# Patient Record
Sex: Female | Born: 1965 | Race: Black or African American | Hispanic: No | Marital: Single | Smoking: Never smoker
Health system: Southern US, Community
[De-identification: ages and names within clinical notes are randomized; demographics above are authoritative.]

## PROBLEM LIST (undated history)

## (undated) DIAGNOSIS — J40 Bronchitis, not specified as acute or chronic: Secondary | ICD-10-CM

## (undated) HISTORY — PX: TUBAL LIGATION: SHX77

## (undated) HISTORY — PX: GASTRIC BYPASS: SHX52

## (undated) HISTORY — PX: NECK SURGERY: SHX720

## (undated) HISTORY — PX: SHOULDER SURGERY: SHX246

---

## 2018-12-13 ENCOUNTER — Ambulatory Visit
Admission: EM | Admit: 2018-12-13 | Discharge: 2018-12-13 | Disposition: A | Discharge status: Home / Self Care | Payer: Self-pay | Attending: Physician Assistant | Admitting: Physician Assistant

## 2018-12-13 DIAGNOSIS — Y92120 Kitchen in nursing home as the place of occurrence of the external cause: Secondary | ICD-10-CM

## 2018-12-13 DIAGNOSIS — X131XXA Other contact with steam and other hot vapors, initial encounter: Secondary | ICD-10-CM

## 2018-12-13 DIAGNOSIS — T23261A Burn of second degree of back of right hand, initial encounter: Secondary | ICD-10-CM

## 2018-12-13 HISTORY — DX: Bronchitis, not specified as acute or chronic: J40

## 2018-12-13 MED ORDER — SILVER SULFADIAZINE 1 % EX CREA: 1.0000 "application " | TOPICAL_CREAM | Freq: Every day | CUTANEOUS | 0 refills | Status: DC

## 2018-12-13 NOTE — Discharge Instructions (Addendum)
For Silvadene as directed to prevent infection.  Try to keep blister intact.  Ice compress 2-3 times a day.  Follow-up with occupational health for further evaluation and management needed.  If experiencing worsening symptoms, spreading redness, increased warmth, increased pain, fever, purulent drainage, follow-up for reevaluation needed.

## 2018-12-13 NOTE — ED Provider Notes (Signed)
[facility]    CSN: [id number] Arrival date & time: 12/13/18  1421      History   Chief Complaint Chief Complaint  Patient presents with  . Burn    HPI Amber Arias is a 53 y.o. female.   53 year old female comes in for right hand burn while working. 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 States was working when steam burned her hand. She applied ice compress with some relief.  She has 2 blisters to the area that is still intact.  Denies trouble moving fingers, numbness, tingling.      Past Medical History:  Diagnosis Date  . Bronchitis     There are no active problems to display for this patient.   Past Surgical History:  Procedure Laterality Date  . GASTRIC BYPASS    . NECK SURGERY    . SHOULDER SURGERY    . TUBAL LIGATION      OB History   No obstetric history on file.      Home Medications    Prior to Admission medications   Medication Sig Start Date End Date Taking? Authorizing Provider  silver sulfADIAZINE (SILVADENE) 1 % cream Apply 1 application topically daily. 12/13/18   Belinda FisherYu, Anabel Lykins V, PA-C    Family History No family history on file.  Social History Social History   Tobacco Use  . Smoking status: Never Smoker  . Smokeless tobacco: Never Used  Substance Use Topics  . Alcohol use: Not Currently  . Drug use: Not on file     Allergies   Codeine and Lyrica [pregabalin]   Review of Systems Review of Systems  Reason unable to perform ROS: See HPI as above.     Physical Exam Triage Vital Signs ED Triage Vitals  Enc Vitals Group     BP 12/13/18 1432 128/84     Pulse Rate 12/13/18 1432 79     Resp 12/13/18 1432 18     Temp 12/13/18 1432 98.3 F (36.8 C)     Temp Source 12/13/18 1432 Oral     SpO2 12/13/18 1432 96 %     Weight --      Height --      Head Circumference --      Peak Flow --      Pain Score 12/13/18 1433 6     Pain Loc --      Pain Edu? --      Excl. in GC? --    No data found.   Updated Vital Signs BP 128/84 (BP Location: Left Arm)   Pulse 79   Temp 98.3 F (36.8 C) (Oral)   Resp 18   SpO2 96%       Physical Exam Constitutional:      General: She is not in acute distress.    Appearance: She is well-developed. She is not diaphoretic.  HENT:     Head: Normocephalic and atraumatic.  Eyes:     Conjunctiva/sclera: Conjunctivae normal.     Pupils: Pupils are equal, round, and reactive to light.  Pulmonary:     Effort: Pulmonary effort is normal. No respiratory distress.  Musculoskeletal:     Comments: Diffuse erythema to the distal dorsal right hand. 2 intact blisters. No finger swelling. Full ROM of fingers. Sensation intact. Cap refill <2s  Skin:    General: Skin is warm and dry.  Neurological:     Mental Status: She is alert and oriented to person,  place, and time.      UC Treatments / Results  Labs (all labs ordered are listed, but only abnormal results are displayed) Labs Reviewed - No data to display  EKG   Radiology No results found.  Procedures Procedures (including critical care time)  Medications Ordered in UC Medications - No data to display  Initial Impression / Assessment and Plan / UC Course  I have reviewed the triage vital signs and the nursing notes.  Pertinent labs & imaging results that were available during my care of the patient were reviewed by me and considered in my medical decision making (see chart for details).    Discussed wound care instructions. Return precautions given. Otherwise, follow up with occ health for continued monitoring.   Final Clinical Impressions(s) / UC Diagnoses   Final diagnoses:  Partial thickness burn of back of right hand, initial encounter    ED Prescriptions    Medication Sig Dispense Auth. Provider   silver sulfADIAZINE (SILVADENE) 1 % cream Apply 1 application topically daily. 50 g Tobin Chad, PA-C 12/13/18 1500

## 2018-12-13 NOTE — ED Triage Notes (Signed)
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 Blisters noted

## 2019-03-13 ENCOUNTER — Other Ambulatory Visit: Payer: Self-pay

## 2019-03-13 ENCOUNTER — Emergency Department (HOSPITAL_COMMUNITY)
Admission: EM | Admit: 2019-03-13 | Discharge: 2019-03-13 | Disposition: A | Discharge status: Home / Self Care | Payer: Self-pay | Attending: Emergency Medicine | Admitting: Emergency Medicine

## 2019-03-13 DIAGNOSIS — K625 Hemorrhage of anus and rectum: Secondary | ICD-10-CM | POA: Insufficient documentation

## 2019-03-13 DIAGNOSIS — Z5321 Procedure and treatment not carried out due to patient leaving prior to being seen by health care provider: Secondary | ICD-10-CM | POA: Insufficient documentation

## 2019-03-13 LAB — CBC WITH DIFFERENTIAL/PLATELET
Abs Immature Granulocytes: 0.02 10*3/uL (ref 0.00–0.07)
Basophils Absolute: 0 10*3/uL (ref 0.0–0.1)
Basophils Relative: 1 %
Eosinophils Absolute: 0.1 10*3/uL (ref 0.0–0.5)
Eosinophils Relative: 1 %
HCT: 42.3 % (ref 36.0–46.0)
Hemoglobin: 13.5 g/dL (ref 12.0–15.0)
Immature Granulocytes: 0 %
Lymphocytes Relative: 29 %
Lymphs Abs: 2.2 10*3/uL (ref 0.7–4.0)
MCH: 29.7 pg (ref 26.0–34.0)
MCHC: 31.9 g/dL (ref 30.0–36.0)
MCV: 93 fL (ref 80.0–100.0)
Monocytes Absolute: 0.7 10*3/uL (ref 0.1–1.0)
Monocytes Relative: 9 %
Neutro Abs: 4.4 10*3/uL (ref 1.7–7.7)
Neutrophils Relative %: 60 %
Platelets: 326 10*3/uL (ref 150–400)
RBC: 4.55 MIL/uL (ref 3.87–5.11)
RDW: 12.9 % (ref 11.5–15.5)
WBC: 7.4 10*3/uL (ref 4.0–10.5)
nRBC: 0 % (ref 0.0–0.2)

## 2019-03-13 LAB — BASIC METABOLIC PANEL
Anion gap: 9 (ref 5–15)
BUN: 13 mg/dL (ref 6–20)
CO2: 26 mmol/L (ref 22–32)
Calcium: 8.7 mg/dL — ABNORMAL LOW (ref 8.9–10.3)
Chloride: 105 mmol/L (ref 98–111)
Creatinine, Ser: 0.63 mg/dL (ref 0.44–1.00)
GFR calc Af Amer: >60 mL/min (ref >60)
GFR calc non Af Amer: >60 mL/min (ref >60)
Glucose, Bld: 139 mg/dL — ABNORMAL HIGH (ref 70–99)
Potassium: 3.6 mmol/L (ref 3.5–5.1)
Sodium: 140 mmol/L (ref 135–145)

## 2019-03-13 NOTE — ED Notes (Signed)
CURRENTLY TALKING ON PHONE.

## 2019-03-13 NOTE — ED Notes (Signed)
Pt has pink top tube in mini lab if needed

## 2019-03-13 NOTE — ED Triage Notes (Signed)
Pt presents with c/o rectal bleeding. Pt reports that she has hemorrhoids but has been passing clots and blood since last night. Pt reports her hemorrhoids have been bleeding off and on for the past week.

## 2019-05-30 ENCOUNTER — Ambulatory Visit: Payer: Managed Care, Other (non HMO)

## 2019-09-21 ENCOUNTER — Telehealth: Payer: Self-pay | Admitting: Hematology and Oncology

## 2019-09-21 NOTE — Telephone Encounter (Signed)
Received a new patient referral from [facility] for IDA. Ms. Amber Arias returned my call and has been scheduled to see Dr. Bertis Ruddy on 4/30 at 1:30pm. Pt aware to arrive 15 minutes early.

## 2019-09-28 ENCOUNTER — Inpatient Hospital Stay: Payer: Managed Care, Other (non HMO) | Attending: Hematology and Oncology | Admitting: Hematology and Oncology

## 2019-09-28 ENCOUNTER — Encounter: Payer: Self-pay | Admitting: Hematology and Oncology

## 2019-09-28 ENCOUNTER — Other Ambulatory Visit: Payer: Self-pay

## 2019-09-28 DIAGNOSIS — Z833 Family history of diabetes mellitus: Secondary | ICD-10-CM | POA: Insufficient documentation

## 2019-09-28 DIAGNOSIS — K909 Intestinal malabsorption, unspecified: Secondary | ICD-10-CM | POA: Diagnosis not present

## 2019-09-28 DIAGNOSIS — Z9884 Bariatric surgery status: Secondary | ICD-10-CM | POA: Diagnosis not present

## 2019-09-28 DIAGNOSIS — E538 Deficiency of other specified B group vitamins: Secondary | ICD-10-CM | POA: Insufficient documentation

## 2019-09-28 DIAGNOSIS — D508 Other iron deficiency anemias: Secondary | ICD-10-CM | POA: Diagnosis not present

## 2019-09-28 DIAGNOSIS — Z79899 Other long term (current) drug therapy: Secondary | ICD-10-CM | POA: Diagnosis not present

## 2019-09-28 DIAGNOSIS — E559 Vitamin D deficiency, unspecified: Secondary | ICD-10-CM | POA: Diagnosis not present

## 2019-09-28 DIAGNOSIS — Z8249 Family history of ischemic heart disease and other diseases of the circulatory system: Secondary | ICD-10-CM | POA: Diagnosis not present

## 2019-09-28 DIAGNOSIS — D509 Iron deficiency anemia, unspecified: Secondary | ICD-10-CM | POA: Insufficient documentation

## 2019-09-28 NOTE — Assessment & Plan Note (Signed)
Due to her history of intestinal bypass surgery, she would be at risk for multiple mineral deficiencies Thankfully, her recent blood work several weeks ago were within normal limits I plan to see her once a year for further follow-up and recheck blood work In the meantime, she will continue on multiple supplements

## 2019-09-28 NOTE — Progress Notes (Signed)
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 female is here because of history of gastric bypass surgery causing multiple mineral deficiencies According to the patient, she had intestinal bypass surgery approximately 11 years ago She did not know the type of gastric bypass that she had She has been placed on multiple mineral replacement therapy including iron supplement, vitamin B12, multivitamin along with vitamin D 2 to 3 years ago, she was found to be profoundly anemic and had received intravenous iron infusion She has not received any blood transfusion support She is being referred here to establish care since she has relocated to [facility] I have the opportunity to review her CBC and other blood work from 2020 and most recently from April 2021 and her vitamin B12 and iron studies were adequate  MEDICAL HISTORY:  Past Medical History:  Diagnosis Date  . Bronchitis     SURGICAL HISTORY: Past Surgical History:  Procedure Laterality Date  . GASTRIC BYPASS    . NECK SURGERY    . SHOULDER SURGERY    . TUBAL LIGATION      SOCIAL HISTORY: Social History   Socioeconomic History  . Marital status: Single    Spouse name: Not on file  . Number of children: 2  . Years of education: Not on file  . Highest education level: Not on file  Occupational History  . Occupation: Designer, jewellery  Tobacco Use  . Smoking status: Never Smoker  . Smokeless tobacco: Never Used  Substance and Sexual Activity  . Alcohol use: Not Currently  . Drug use: Not on file  . Sexual activity: Not on file  Other Topics Concern  . Not on file  Social History Narrative  . Not on file   Social Determinants of Health   Financial Resource Strain:   .  Difficulty of Paying Living Expenses:   Food Insecurity:   . Worried About Charity fundraiser in the Last Year:   . Arboriculturist in the Last Year:   Transportation Needs:   . Film/video editor (Medical):   Marland Kitchen Lack of Transportation (Non-Medical):   Physical Activity:   . Days of Exercise per Week:   . Minutes of Exercise per Session:   Stress:   . Feeling of Stress :   Social Connections:   . Frequency of Communication with Friends and Family:   . Frequency of Social Gatherings with Friends and Family:   . Attends Religious Services:   . Active Member of Clubs or Organizations:   . Attends Archivist Meetings:   Marland Kitchen Marital Status:   Intimate Partner Violence:   . Fear of Current or Ex-Partner:   . Emotionally Abused:   Marland Kitchen Physically Abused:   . Sexually Abused:     FAMILY HISTORY: Family History  Problem Relation Age of Onset  . Diabetes Mother   . Hypertension Mother   . Chronic Renal Failure Mother   . Diabetes Father   . Hypertension Father     ALLERGIES:  is allergic to codeine; lyrica [pregabalin]; and penicillins.  MEDICATIONS:  Current Outpatient Medications  Medication Sig Dispense Refill  . cholecalciferol (VITAMIN D3) 25 MCG (1000 UNIT) tablet Take 1,000 Units by mouth daily.    Marland Kitchen docusate sodium (COLACE)  100 MG capsule Take 100 mg by mouth 2 (two) times daily.    . ferrous sulfate 325 (65 FE) MG tablet Take 325 mg by mouth daily with breakfast.    . vitamin B-12 (CYANOCOBALAMIN) 1000 MCG tablet Take 1,000 mcg by mouth daily.     No current facility-administered medications for this visit.    REVIEW OF SYSTEMS:   Constitutional: Denies fevers, chills or abnormal night sweats Eyes: Denies blurriness of vision, double vision or watery eyes Ears, nose, mouth, throat, and face: Denies mucositis or sore throat Respiratory: Denies cough, dyspnea or wheezes Cardiovascular: Denies palpitation, chest discomfort or lower extremity  swelling Gastrointestinal:  Denies nausea, heartburn or change in bowel habits Skin: Denies abnormal skin rashes Lymphatics: Denies new lymphadenopathy or easy bruising Neurological:Denies numbness, tingling or new weaknesses Behavioral/Psych: Mood is stable, no new changes  All other systems were reviewed with the patient and are negative.  PHYSICAL EXAMINATION: ECOG PERFORMANCE STATUS: 0 - Asymptomatic  Vitals:   09/28/19 1354  BP: 118/67  Pulse: 86  Resp: 18  Temp: 98.5 F (36.9 C)  SpO2: 100%   Filed Weights   09/28/19 1354  Weight: 192 lb 6.4 oz (87.3 kg)    GENERAL:alert, no distress and comfortable SKIN: skin color, texture, turgor are normal, no rashes or significant lesions EYES: normal, conjunctiva are pink and non-injected, sclera clear OROPHARYNX:no exudate, no erythema and lips, buccal mucosa, and tongue normal  NECK: supple, thyroid normal size, non-tender, without nodularity LYMPH:  no palpable lymphadenopathy in the cervical, axillary or inguinal LUNGS: clear to auscultation and percussion with normal breathing effort HEART: regular rate & rhythm and no murmurs and no lower extremity edema ABDOMEN:abdomen soft, non-tender and normal bowel sounds Musculoskeletal:no cyanosis of digits and no clubbing  PSYCH: alert & oriented x 3 with fluent speech NEURO: no focal motor/sensory deficits  ASSESSMENT & PLAN:  S/P gastric bypass Due to her history of intestinal bypass surgery, she would be at risk for multiple mineral deficiencies Thankfully, her recent blood work several weeks ago were within normal limits I plan to see her once a year for further follow-up and recheck blood work In the meantime, she will continue on multiple supplements  Iron deficiency anemia Her recent iron studies are adequate I plan to recheck again next year  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 CBC with Differential/Platelet    Standing Status:   Future    Standing Expiration Date:   11/01/2020  . Ferritin    Standing Status:   Future    Standing Expiration Date:   09/27/2020  . Iron and TIBC    Standing Status:   Future    Standing Expiration Date:   11/01/2020  . Vitamin B12    Standing Status:   Future    Standing Expiration Date:   11/01/2020  . VITAMIN D 25 Hydroxy (Vit-D Deficiency, Fractures)    Standing Status:   Future    Standing Expiration Date:   09/27/2020     All questions were answered. The patient knows to call the clinic with any problems, questions or concerns.  The total time spent in the appointment was 40 minutes encounter with patients  including review of chart and various tests results, discussions about plan of care and coordination of care plan  Artis Delay, MD 4/30/20216:17 PM       Artis Delay, MD 09/28/19 6:17 PM

## 2019-09-28 NOTE — Assessment & Plan Note (Signed)
Her last vitamin B12 level was adequate She will continue on high-dose vitamin B12 replacement therapy I plan to recheck it next year

## 2019-09-28 NOTE — Assessment & Plan Note (Signed)
Her recent iron studies are adequate I plan to recheck again next year

## 2019-09-28 NOTE — Assessment & Plan Note (Signed)
She is on high-dose vitamin D supplement I plan to recheck it next year

## 2019-10-01 ENCOUNTER — Telehealth: Payer: Self-pay | Admitting: Hematology and Oncology

## 2019-10-01 NOTE — Telephone Encounter (Signed)
sch msg per 4/30. Pt is aware of appts.

## 2019-10-29 ENCOUNTER — Other Ambulatory Visit: Payer: Self-pay

## 2019-10-29 ENCOUNTER — Emergency Department (HOSPITAL_COMMUNITY): Payer: Managed Care, Other (non HMO)

## 2019-10-29 ENCOUNTER — Emergency Department (HOSPITAL_COMMUNITY)
Admission: EM | Admit: 2019-10-29 | Discharge: 2019-10-30 | Disposition: A | Discharge status: Home / Self Care | Payer: Managed Care, Other (non HMO) | Attending: Emergency Medicine | Admitting: Emergency Medicine

## 2019-10-29 ENCOUNTER — Encounter (HOSPITAL_COMMUNITY): Payer: Self-pay

## 2019-10-29 DIAGNOSIS — R1110 Vomiting, unspecified: Secondary | ICD-10-CM | POA: Diagnosis present

## 2019-10-29 DIAGNOSIS — N390 Urinary tract infection, site not specified: Secondary | ICD-10-CM | POA: Diagnosis not present

## 2019-10-29 LAB — URINALYSIS, ROUTINE W REFLEX MICROSCOPIC
Bilirubin Urine: NEGATIVE
Glucose, UA: >=500 mg/dL — AB
Hgb urine dipstick: NEGATIVE
Ketones, ur: NEGATIVE mg/dL
Nitrite: POSITIVE — AB
Protein, ur: NEGATIVE mg/dL
Specific Gravity, Urine: 1.025 (ref 1.005–1.030)
pH: 5 (ref 5.0–8.0)

## 2019-10-29 LAB — COMPREHENSIVE METABOLIC PANEL
ALT: 21 U/L (ref 0–44)
AST: 25 U/L (ref 15–41)
Albumin: 3.8 g/dL (ref 3.5–5.0)
Alkaline Phosphatase: 67 U/L (ref 38–126)
Anion gap: 10 (ref 5–15)
BUN: 12 mg/dL (ref 6–20)
CO2: 24 mmol/L (ref 22–32)
Calcium: 8.9 mg/dL (ref 8.9–10.3)
Chloride: 104 mmol/L (ref 98–111)
Creatinine, Ser: 0.85 mg/dL (ref 0.44–1.00)
GFR calc Af Amer: >60 mL/min (ref >60)
GFR calc non Af Amer: >60 mL/min (ref >60)
Glucose, Bld: 247 mg/dL — ABNORMAL HIGH (ref 70–99)
Potassium: 3.5 mmol/L (ref 3.5–5.1)
Sodium: 138 mmol/L (ref 135–145)
Total Bilirubin: 0.4 mg/dL (ref 0.3–1.2)
Total Protein: 7.2 g/dL (ref 6.5–8.1)

## 2019-10-29 LAB — CBC
HCT: 42.3 % (ref 36.0–46.0)
Hemoglobin: 13.7 g/dL (ref 12.0–15.0)
MCH: 29.7 pg (ref 26.0–34.0)
MCHC: 32.4 g/dL (ref 30.0–36.0)
MCV: 91.8 fL (ref 80.0–100.0)
Platelets: 355 10*3/uL (ref 150–400)
RBC: 4.61 MIL/uL (ref 3.87–5.11)
RDW: 12.8 % (ref 11.5–15.5)
WBC: 8.9 10*3/uL (ref 4.0–10.5)
nRBC: 0 % (ref 0.0–0.2)

## 2019-10-29 LAB — LIPASE, BLOOD: Lipase: 33 U/L (ref 11–51)

## 2019-10-29 MED ORDER — SODIUM CHLORIDE 0.9 % IV BOLUS
1000.0000 mL | Freq: Once | INTRAVENOUS | Status: AC
  Administered 2019-10-29: 1000 mL via INTRAVENOUS

## 2019-10-29 MED ORDER — SODIUM CHLORIDE 0.9% FLUSH: 3.0000 mL | Freq: Once | INTRAVENOUS | Status: DC

## 2019-10-29 NOTE — ED Notes (Signed)
PT to CT.

## 2019-10-29 NOTE — ED Notes (Signed)
Reports three bouts of emesis after eating at [employer] around 3pm.  Pt reports dry-heaves, due to having gastric bipass in 2010.  No other complaints.

## 2019-10-29 NOTE — ED Provider Notes (Addendum)
[place] [facility] DEPARTMENT Provider Note   CSN: [id number] Arrival date & time: 10/29/19  1638     History Chief Complaint  Patient presents with  . Emesis    Amber Arias is a 54 y.o. female.  Pt complains of not being able to stay awake.  Pt reports she has trouble keeping her eyes open.  Pt's daughter reports she has never seen her Mother like this.  Pt complains of nausea.  No abdominal pain no vomiting.  Pt worked her job today without problem   The history is provided by the patient. No language interpreter was used.       Past Medical History:  Diagnosis Date  . Bronchitis     Patient Active Problem List   Diagnosis Date Noted  . 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 Iron deficiency anemia 09/28/2019  . 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 Vitamin D deficiency 09/28/2019    Past Surgical History:  Procedure Laterality Date  . GASTRIC BYPASS    . NECK SURGERY    . SHOULDER SURGERY    . TUBAL LIGATION       OB History   No obstetric history on file.     Family History  Problem Relation Age of Onset  . Diabetes Mother   . Hypertension Mother   . Chronic Renal Failure Mother   . Diabetes Father   . Hypertension Father     Social History   Tobacco Use  . Smoking status: Never Smoker  . Smokeless tobacco: Never Used  Substance Use Topics  . Alcohol use: Not Currently  . Drug use: Not on file    Home Medications Prior to Admission medications   Medication Sig Start Date End Date Taking? Authorizing Provider  cholecalciferol (VITAMIN D3) 25 MCG (1000 UNIT) tablet Take 1,000 Units by mouth daily.    [provider]  docusate sodium (COLACE) 100 MG capsule Take 100 mg by mouth 2 (two) times daily.    [provider]  ferrous sulfate 325 (65 FE) MG tablet Take 325 mg by mouth daily with breakfast.    [provider]  vitamin B-12 (CYANOCOBALAMIN) 1000 MCG tablet Take  1,000 mcg by mouth daily.    [provider]    Allergies    Codeine, Lyrica [pregabalin], and Penicillins  Review of Systems   Review of Systems  Neurological: Positive for weakness.  All other systems reviewed and are negative.   Physical Exam Updated Vital Signs BP 114/71   Pulse 69   Temp 97.8 F (36.6 C) (Oral)   Resp 18   SpO2 100%   Physical Exam Vitals and nursing note reviewed.  Constitutional:      Appearance: She is well-developed.     Comments: Sleepy, difficulty staying awake during conversation  HENT:     Head: Normocephalic.     Nose: Nose normal.     Mouth/Throat:     Mouth: Mucous membranes are moist.  Cardiovascular:     Rate and Rhythm: Normal rate and regular rhythm.  Pulmonary:     Effort: Pulmonary effort is normal.  Abdominal:     General: There is no distension.  Musculoskeletal:        General: Normal range of motion.     Cervical back: Normal range of motion.  Skin:    General: Skin is warm.  Neurological:     Mental Status: She is alert and oriented to person,  place, and time.  Psychiatric:        Mood and Affect: Mood normal.     ED Results / Procedures / Treatments   Labs (all labs ordered are listed, but only abnormal results are displayed) Labs Reviewed  COMPREHENSIVE METABOLIC PANEL - Abnormal; Notable for the following components:      Result Value   Glucose, Bld 247 (*)    All other components within normal limits  URINALYSIS, ROUTINE W REFLEX MICROSCOPIC - Abnormal; Notable for the following components:   Color, Urine AMBER (*)    APPearance CLOUDY (*)    Glucose, UA >=500 (*)    Nitrite POSITIVE (*)    Leukocytes,Ua TRACE (*)    Bacteria, UA MANY (*)    All other components within normal limits  LIPASE, BLOOD  CBC    EKG None  Radiology No results found.  Procedures Procedures (including critical care time)  Medications Ordered in ED Medications  sodium chloride flush (NS) 0.9 % injection 3 mL  (has no administration in time range)  sodium chloride 0.9 % bolus 1,000 mL (1,000 mLs Intravenous New Bag/Given 10/29/19 2231)    ED Course  I have reviewed the triage vital signs and the nursing notes.  Pertinent labs & imaging results that were available during my care of the patient were reviewed by me and considered in my medical decision making (see chart for details).    MDM Rules/Calculators/A&P                      MDM:  Labs reviewed.  Ua shows many bacteria trace leukocyte  Nitrate positive  Pt reports she feels much better after IV fluids.  Pt's ct scan is normal.   Final Clinical Impression(s) / ED Diagnoses Final diagnoses:  Urinary tract infection without hematuria, site unspecified    Rx / DC Orders ED Discharge Orders         Ordered    cephALEXin (KEFLEX) 500 MG capsule  4 times daily     10/30/19 0003        An After Visit Summary was printed and given to the patient.    Elson Areas, PA-C 10/30/19 0010    Elson Areas, PA-C 10/30/19 0014    Derwood Kaplan, MD 11/05/19 [phone]

## 2019-10-29 NOTE — ED Triage Notes (Signed)
Pt reports she is not able to keep her eyes open and vomiting starting this am

## 2019-10-30 MED ORDER — CEPHALEXIN 500 MG PO CAPS: 500.0000 mg | ORAL_CAPSULE | Freq: Four times a day (QID) | ORAL | 0 refills | Status: AC

## 2019-10-30 MED ORDER — CEPHALEXIN 250 MG PO CAPS
500.0000 mg | ORAL_CAPSULE | Freq: Once | ORAL | Status: AC
  Administered 2019-10-30: 500 mg via ORAL
  Filled 2019-10-30: qty 2

## 2019-10-30 NOTE — Discharge Instructions (Addendum)
Return if any problems.

## 2020-05-26 ENCOUNTER — Other Ambulatory Visit: Payer: Self-pay

## 2020-05-26 ENCOUNTER — Emergency Department (HOSPITAL_COMMUNITY)
Admission: EM | Admit: 2020-05-26 | Discharge: 2020-05-26 | Disposition: A | Discharge status: Home / Self Care | Payer: Managed Care, Other (non HMO) | Attending: Emergency Medicine | Admitting: Emergency Medicine

## 2020-05-26 DIAGNOSIS — M792 Neuralgia and neuritis, unspecified: Secondary | ICD-10-CM | POA: Insufficient documentation

## 2020-05-26 DIAGNOSIS — R739 Hyperglycemia, unspecified: Secondary | ICD-10-CM | POA: Insufficient documentation

## 2020-05-26 DIAGNOSIS — G629 Polyneuropathy, unspecified: Secondary | ICD-10-CM

## 2020-05-26 LAB — I-STAT CHEM 8, ED
BUN: 12 mg/dL (ref 6–20)
Calcium, Ion: 1.18 mmol/L (ref 1.15–1.40)
Chloride: 104 mmol/L (ref 98–111)
Creatinine, Ser: 0.6 mg/dL (ref 0.44–1.00)
Glucose, Bld: 230 mg/dL — ABNORMAL HIGH (ref 70–99)
HCT: 40 % (ref 36.0–46.0)
Hemoglobin: 13.6 g/dL (ref 12.0–15.0)
Potassium: 3.7 mmol/L (ref 3.5–5.1)
Sodium: 140 mmol/L (ref 135–145)
TCO2: 22 mmol/L (ref 22–32)

## 2020-05-26 MED ORDER — TRAMADOL HCL 50 MG PO TABS: 50.0000 mg | ORAL_TABLET | Freq: Four times a day (QID) | ORAL | 0 refills | Status: DC | PRN

## 2020-05-26 MED ORDER — OXYCODONE-ACETAMINOPHEN 5-325 MG PO TABS
1.0000 | ORAL_TABLET | ORAL | Status: AC | PRN
  Administered 2020-05-26 (×2): 1 via ORAL
  Filled 2020-05-26 (×2): qty 1

## 2020-05-26 NOTE — ED Triage Notes (Signed)
Pt presents to ED POV. Pt c/o nueropathy pain. Pt states that she usually doesn't have to take anything but pain is worse than usual.

## 2020-05-26 NOTE — ED Provider Notes (Signed)
[facility] EMERGENCY DEPARTMENT Provider Note   CSN: [id number] Arrival date & time: 05/26/20  1434     History No chief complaint on file.   Amber Arias is a 54 y.o. female.  Patient is a 54 year old female who presents with burning nerve pain to her hands and feet.  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  She says she always has a burning pain in her left foot and left hand.  Over the last couple of weeks she has been more active and has been working harder and up on her feet more.  She is had increasing burning pain to her left leg and also now has some burning pain to her right foot.  She describes burning to the bottom of both feet but it is worse on the left.  She says it is worse than her typical neuropathic pain.  She denies any known injuries.  She denies any weakness in the legs other than occasionally she has the feeling that her left leg will give out but this is chronic and unchanged.  She also has carpal tunnel in her right hand and has some tingling to the third fourth and fifth fingers with some weakness in these fingers.  This is chronic and unchanged from her baseline.  Her new symptoms seem to be burning pain in both of her feet, worse in the left.  She has chronic pain in the left but now it is worse.  She feels like its been from her being more active recently.  She denies any worsening neck pain.  No fevers.  No back pain.  No recent injuries.  She does not have any change in her speech or vision.  No reported weakness in any of the extremities other than the weakness in her third fourth and fifth fingers of her right hand which is unchanged and related to her diagnosis of carpal tunnel.  She does not have any pain or numbness to her lower legs, it is isolated to her feet.  She denies any difficulty with her balance or gait issues.  She says it hurts to walk on them but she does not  have any difficulty moving her legs or walking other than the pain.        Past Medical History:  Diagnosis Date  . Bronchitis     Patient Active Problem List   Diagnosis Date Noted  . 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 Iron deficiency anemia 09/28/2019  . 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 Vitamin D deficiency 09/28/2019    Past Surgical History:  Procedure Laterality Date  . GASTRIC BYPASS    . NECK SURGERY    . SHOULDER SURGERY    . TUBAL LIGATION       OB History   No obstetric history on file.     Family History  Problem Relation Age of Onset  . Diabetes Mother   . Hypertension Mother   . Chronic Renal Failure Mother   . Diabetes Father   . Hypertension Father     Social History   Tobacco Use  . Smoking status: Never Smoker  . Smokeless tobacco: Never Used  Substance Use Topics  . Alcohol use: Not Currently    Home Medications Prior to Admission medications   Medication Sig Start Date End Date Taking? Authorizing Provider  cholecalciferol (VITAMIN D3) 25 MCG (  1000 UNIT) tablet Take 1,000 Units by mouth daily.   Yes [provider]  ferrous sulfate 325 (65 FE) MG tablet Take 325 mg by mouth every other day.   Yes [provider]  fluticasone furoate-vilanterol (BREO ELLIPTA) 100-25 MCG/INH AEPB Inhale 1 puff into the lungs daily as needed (for flares).   Yes [provider]  Multiple Vitamins-Calcium (ONE-A-DAY WOMENS FORMULA) TABS Take 2 tablets by mouth daily with breakfast.   Yes [provider]  traMADol (ULTRAM) 50 MG tablet Take 1 tablet (50 mg total) by mouth every 6 (six) hours as needed. 05/26/20  Yes Rolan Bucco, MD  VENTOLIN HFA 108 (90 Base) MCG/ACT inhaler Inhale 2 puffs into the lungs every 6 (six) hours as needed for wheezing or shortness of breath.   Yes [provider]  vitamin B-12 (CYANOCOBALAMIN) 1000 MCG tablet Take 1,000 mcg by mouth daily.   Yes  [provider]    Allergies    Aspirin, Codeine, Penicillin g, Penicillins, and Pregabalin  Review of Systems   Review of Systems  Constitutional: Negative for chills, diaphoresis, fatigue and fever.  HENT: Negative for congestion, rhinorrhea and sneezing.   Eyes: Negative.   Respiratory: Negative for cough, chest tightness and shortness of breath.   Cardiovascular: Negative for chest pain and leg swelling.  Gastrointestinal: Negative for abdominal pain, blood in stool, diarrhea, nausea and vomiting.  Genitourinary: Negative for difficulty urinating, flank pain, frequency and hematuria.  Musculoskeletal: Positive for arthralgias and neck pain (Chronic). Negative for back pain.       Burning pain to the bottom of both feet and both hands.  Skin: Negative for rash.  Neurological: Negative for dizziness, speech difficulty, weakness, numbness and headaches.    Physical Exam Updated Vital Signs BP 104/73   Pulse 65   Temp 97.8 F (36.6 C) (Oral)   Resp 16   SpO2 97%   Physical Exam Constitutional:      Appearance: She is well-developed and well-nourished.  HENT:     Head: Normocephalic and atraumatic.  Eyes:     Pupils: Pupils are equal, round, and reactive to light.  Cardiovascular:     Rate and Rhythm: Normal rate and regular rhythm.     Heart sounds: Normal heart sounds.  Pulmonary:     Effort: Pulmonary effort is normal. No respiratory distress.     Breath sounds: Normal breath sounds. No wheezing or rales.  Chest:     Chest wall: No tenderness.  Abdominal:     General: Bowel sounds are normal.     Palpations: Abdomen is soft.     Tenderness: There is no abdominal tenderness. There is no guarding or rebound.  Musculoskeletal:        General: No edema. Normal range of motion.     Cervical back: Normal range of motion and neck supple.  Lymphadenopathy:     Cervical: No cervical adenopathy.  Skin:    General: Skin is warm and dry.     Findings: No rash.   Neurological:     Mental Status: She is alert and oriented to person, place, and time.     Comments: Motor 5 out of 5 all extremities, she has little bit of weakness in the third fourth and fifth fingers of her right hand which she says is chronic and from her carpal tunnel but she has good strength in both upper extremities.  It symmetric in all extremities.  She has normal sensation to light  touch in all extremities, cranial nerves II through XII grossly intact  Psychiatric:        Mood and Affect: Mood and affect normal.     ED Results / Procedures / Treatments   Labs (all labs ordered are listed, but only abnormal results are displayed) Labs Reviewed  I-STAT CHEM 8, ED - Abnormal; Notable for the following components:      Result Value   Glucose, Bld 230 (*)    All other components within normal limits    EKG None  Radiology No results found.  Procedures Procedures (including critical care time)  Medications Ordered in ED Medications  oxyCODONE-acetaminophen (PERCOCET/ROXICET) 5-325 MG per tablet 1 tablet (1 tablet Oral Given 05/26/20 1843)    ED Course  I have reviewed the triage vital signs and the nursing notes.  Pertinent labs & imaging results that were available during my care of the patient were reviewed by me and considered in my medical decision making (see chart for details).    MDM Rules/Calculators/A&P                          Patient is a 55 year old female who presents with burning nerve type pain to her hands and feet.  She has had some chronic symptoms for a while but says they have gotten worse.  She does not have any weakness or strokelike symptoms.  It does not seem to be consistent with a stroke.  It seems to be more painful and nervelike pain.  She does not have any worsening neck pain or deficits that would be concerning for a significant cervical lesion.  She is afebrile.  Her electrolytes are nonconcerning.  Her glucose is elevated at 230.  This  may be contributing to her neuropathy.  She is not a known diabetic.  She had moved down here 2 years ago and has not yet established primary care.  She is currently uninsured but says that she is in the process of signing up for an insurance plan with her work.  I will consult transition of care team to attempt to get her primary care appointment expeditiously.  I did also give her a couple options for follow-up.  I discussed with her diet choices to help better control her sugars and will need further work-up as an outpatient.  She was discharged home in good condition.  Return precautions were given. Final Clinical Impression(s) / ED Diagnoses Final diagnoses:  Neuropathy  Hyperglycemia    Rx / DC Orders ED Discharge Orders         Ordered    traMADol (ULTRAM) 50 MG tablet  Every 6 hours PRN        05/26/20 2045           Rolan Bucco, MD 05/26/20 2050

## 2020-06-18 ENCOUNTER — Encounter: Payer: Self-pay | Admitting: Student

## 2020-06-23 ENCOUNTER — Encounter: Payer: Self-pay | Admitting: Student

## 2020-07-02 ENCOUNTER — Ambulatory Visit (INDEPENDENT_AMBULATORY_CARE_PROVIDER_SITE_OTHER): Payer: Self-pay | Admitting: Student

## 2020-07-02 ENCOUNTER — Other Ambulatory Visit: Payer: Self-pay

## 2020-07-02 ENCOUNTER — Encounter: Payer: Self-pay | Admitting: Student

## 2020-07-02 VITALS — BP 121/74 | HR 67 | Temp 98.0°F | Wt 190.6 lb

## 2020-07-02 DIAGNOSIS — T148XXA Other injury of unspecified body region, initial encounter: Secondary | ICD-10-CM

## 2020-07-02 DIAGNOSIS — R739 Hyperglycemia, unspecified: Secondary | ICD-10-CM

## 2020-07-02 DIAGNOSIS — R200 Anesthesia of skin: Secondary | ICD-10-CM

## 2020-07-02 DIAGNOSIS — R202 Paresthesia of skin: Secondary | ICD-10-CM

## 2020-07-02 DIAGNOSIS — R7303 Prediabetes: Secondary | ICD-10-CM

## 2020-07-02 LAB — POCT GLYCOSYLATED HEMOGLOBIN (HGB A1C): Hemoglobin A1C: 5.8 % — AB (ref 4.0–5.6)

## 2020-07-02 LAB — GLUCOSE, CAPILLARY: Glucose-Capillary: 90 mg/dL (ref 70–99)

## 2020-07-02 MED ORDER — DULOXETINE HCL 30 MG PO CPEP: 30.0000 mg | ORAL_CAPSULE | Freq: Every day | ORAL | 2 refills | Status: DC

## 2020-07-02 NOTE — Assessment & Plan Note (Signed)
Assessment: A1c of 5.8 today. Recently elevated glucose levels in the ED  Plan: - Recheck A1c in year. Will call and discuss with patient lab findings.

## 2020-07-02 NOTE — Patient Instructions (Signed)
Thank you, [name] for allowing [place] to provide your care today. Today we discussed your nerve pain.   We will be planning to obtain all of your medical records. Once we have these I will review them to get a better understanding of what has been done thus far.   I will also be starting you on cymbalta. We will start with one capsule a week. If you tolerate this well, take two a day after one week.  I have ordered the following labs for you:   Referrals ordered today:   Referral Orders  No referral(s) requested today     I have ordered the following medication/changed the following medications:   Stop the following medications: Medications Discontinued During This Encounter  Medication Reason  . traMADol (ULTRAM) 50 MG tablet Discontinued by provider     Start the following medications: Meds ordered this encounter  Medications  . DULoxetine (CYMBALTA) 30 MG capsule    Sig: Take 1 capsule (30 mg total) by mouth daily. Take 1 capsule (30 mg) by mouth daily for the first week. Starting on the second week take 2 capsules daily if you were able to tolerate the 30 mg daily.    Dispense:  30 capsule    Refill:  2     Follow up: 3 Months    Remember: If you every feel as though your symptoms are worsening, please call and make an appointment.    Should you have any questions or concerns please call the internal medicine clinic at [phone].     Thalia Bloodgood, D.O. [facility]

## 2020-07-02 NOTE — Progress Notes (Signed)
   CC: neurological pain  HPI:  [name] is a 55 y.o. female with a past medical history stated below and presents today for neurological pain. Please see problem based assessment and plan for additional details.  Past Medical History:  Diagnosis Date  . Bronchitis     Current Outpatient Medications on File Prior to Visit  Medication Sig Dispense Refill  . cholecalciferol (VITAMIN D3) 25 MCG (1000 UNIT) tablet Take 1,000 Units by mouth daily.    . ferrous sulfate 325 (65 FE) MG tablet Take 325 mg by mouth every other day.    . fluticasone furoate-vilanterol (BREO ELLIPTA) 100-25 MCG/INH AEPB Inhale 1 puff into the lungs daily as needed (for flares).    . Multiple Vitamins-Calcium (ONE-A-DAY WOMENS FORMULA) TABS Take 2 tablets by mouth daily with breakfast.    . VENTOLIN HFA 108 (90 Base) MCG/ACT inhaler Inhale 2 puffs into the lungs every 6 (six) hours as needed for wheezing or shortness of breath.    . vitamin B-12 (CYANOCOBALAMIN) 1000 MCG tablet Take 1,000 mcg by mouth daily.     No current facility-administered medications on file prior to visit.    Family History  Problem Relation Age of Onset  . Diabetes Mother   . Hypertension Mother   . Chronic Renal Failure Mother   . Diabetes Father   . Hypertension Father     Social History   Socioeconomic History  . Marital status: Single    Spouse name: Not on file  . Number of children: 2  . Years of education: Not on file  . Highest education level: Not on file  Occupational History  . Occupation: Optometrist  Tobacco Use  . Smoking status: Never Smoker  . Smokeless tobacco: Never Used  Substance and Sexual Activity  . Alcohol use: Not Currently  . Drug use: Not on file  . Sexual activity: Not on file  Other Topics Concern  . Not on file  Social History Narrative  . Not on file   Social Determinants of Health   Financial Resource Strain: Not on file  Food Insecurity: Not on file  Transportation  Needs: Not on file  Physical Activity: Not on file  Stress: Not on file  Social Connections: Not on file  Intimate Partner Violence: Not on file    Review of Systems: ROS negative except for what is noted on the assessment and plan.  Vitals:   07/02/20 1323  BP: 121/74  Pulse: 67  Temp: 98 F (36.7 C)  TempSrc: Oral  SpO2: 100%  Weight: 190 lb 9.6 oz (86.5 kg)    Physical Exam: Constitutional: well-appearing, in no acute distress HENT: normocephalic atraumatic Eyes: conjunctiva non-erythematous Neck: supple Cardiovascular: regular rate and rhythm Pulmonary/Chest: normal work of breathing on room air Abdominal: non-distended MSK: normal bulk and tone. Unremarkable ROM of upper extremities. Dorsalis pedis and posterior tibial pulses 2+ Neurological: alert & oriented x 3. Upper extremity strength 5/5. Lower extremity strength 5/5. 2+ brachial and patellar reflexes bilaterally. Normal sensation throughout. Patient with difficulty contracting right 5th digit. Patient with unremarkable gate.  Skin: warm and dry Psych: Tearful at times when discussing her chronic neurological pain  Assessment & Plan:   See Encounters Tab for problem based charting.  Patient discussed with Dr. Mackey Birchwood, D.O. [facility], PGY-1 Pager: [phone], Phone: [phone] Date 07/02/2020 Time 5:57 PM

## 2020-07-02 NOTE — Assessment & Plan Note (Signed)
Lab Results  Component Value Date   HGBA1C 5.8 (A) 07/02/2020     Assessment: Patient with episodes of hyperglycemia during recent ED visits. A1c repeated today, 5.8. Patient prediabetic.   Plan: - Continue yearly A1c

## 2020-07-02 NOTE — Assessment & Plan Note (Addendum)
Assessment: Patient with extensive neurological history. She endorses in 1995 she fell on ice and had lower back (L5/S1) problems since then. She has struggled with left lower extremity weakness/numbness and tingling. 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 Since then, she endorses having bilateral upper extremity weakness as well as numbness/tingling. In 2010, she had weight loss surgery to help relieve pressure from her lower back.   She describes the pain as numbness/tingling and states the left sided pain has improved, but now her right side is hurting. She is nervous that one day she will wake up and not be able to walk. 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 She has a fear of dropping things at work and therefore puts herself into positions where she can set items down quickly.   The patient was followed by neurosurgery in [facility] and has since moved to [place]. Per the the patient, neurosurgery there informed her that there was nothing she could do about the chronic pain and that no other procedure would be indicated. She notes trying multiple medications for pain including gabapentin, pregabalin, oxycodone, tramadol, tylenol, NSAIDS. She notes the narcotics have helped some but do not last long.   Her neurological exam was unremarkable aside from the contracture of the right 5th digit.    Recommended assessing for reversible causes in setting of gastric bypass surgery, despite patient being adherent to her vitamin regimen. She requests to wait on laboratory testing until her insurance has been approved by the clinic.   Do not believe urgent need for neurological evaluation or imaging at this time. Patient appears stable and to not have drastic acute change in status.  Plan: - Start cymablta 30 mg a day first week, if tolerable increase to 60 mg daily - trazodone discontinued as  interaction with cymbalta for possible serotonin syndrome - if no improvement on cymbalta or unable to tolerate, try TCA - Once insurance approved, order cbc, cmp, B12.  - if pain continues, consider neurology consult - work note, off until 07/07/20

## 2020-07-07 NOTE — Progress Notes (Signed)
Internal Medicine Clinic Attending  Case discussed with Dr. Katsadouros  At the time of the visit.  We reviewed the resident's history and exam and pertinent patient test results.  I agree with the assessment, diagnosis, and plan of care documented in the resident's note.  

## 2020-08-29 ENCOUNTER — Other Ambulatory Visit: Payer: Managed Care, Other (non HMO)

## 2020-08-29 ENCOUNTER — Ambulatory Visit: Payer: Managed Care, Other (non HMO) | Admitting: Hematology and Oncology

## 2020-09-05 ENCOUNTER — Encounter: Payer: Self-pay | Admitting: Hematology and Oncology

## 2020-09-05 ENCOUNTER — Ambulatory Visit: Payer: Self-pay | Admitting: Hematology and Oncology

## 2020-09-05 ENCOUNTER — Inpatient Hospital Stay: Payer: Self-pay | Attending: Hematology and Oncology

## 2020-09-30 ENCOUNTER — Ambulatory Visit: Payer: Self-pay | Admitting: Student

## 2020-09-30 ENCOUNTER — Other Ambulatory Visit: Payer: Self-pay

## 2020-09-30 ENCOUNTER — Encounter: Payer: Self-pay | Admitting: Student

## 2020-09-30 VITALS — BP 112/76 | HR 79 | Temp 98.2°F | Ht 68.0 in | Wt 190.9 lb

## 2020-09-30 DIAGNOSIS — D509 Iron deficiency anemia, unspecified: Secondary | ICD-10-CM

## 2020-09-30 DIAGNOSIS — Z0000 Encounter for general adult medical examination without abnormal findings: Secondary | ICD-10-CM

## 2020-09-30 DIAGNOSIS — R202 Paresthesia of skin: Secondary | ICD-10-CM

## 2020-09-30 DIAGNOSIS — K909 Intestinal malabsorption, unspecified: Secondary | ICD-10-CM

## 2020-09-30 DIAGNOSIS — E538 Deficiency of other specified B group vitamins: Secondary | ICD-10-CM

## 2020-09-30 DIAGNOSIS — R200 Anesthesia of skin: Secondary | ICD-10-CM

## 2020-09-30 MED ORDER — AMITRIPTYLINE HCL 25 MG PO TABS: 25.0000 mg | ORAL_TABLET | Freq: Every day | ORAL | 0 refills | Status: AC

## 2020-09-30 NOTE — Assessment & Plan Note (Signed)
Assessment: Iron studies were planned to be rechecked by oncology however she missed the appointment.  Patient currently uninsured and will plan to wait until she is insured to have oncology check these at her follow-up.   Plan: -Continue to follow with oncology

## 2020-09-30 NOTE — Assessment & Plan Note (Signed)
Patient given information about free mammogram/colorectal screening.

## 2020-09-30 NOTE — Assessment & Plan Note (Addendum)
  Assessment: Continues to have numbness/tingling that is worsening, specifically on the right side.  She notes that she filled out a release of information at her last visit for [place] neurosurgery records from [place].  However I as well as most of her not able to find this.  We will asked the patient to return to fill out these forms.  She was unable to take the Cymbalta as it caused GI upset.  We will discontinue this and try amitriptyline.  In the past she is taking tramadol at night for sleep, however we will start with amitriptyline and see how she does.  She has tried tramadol in the past and that helped her sleep as well as with the pain.  At this point with worsening of her numbness and tingling I do believe she needs referral to either neurosurgery or neurology.  She is working on applying to orange card and would like to have this before being referred.  Neurological exam is unchanged from prior visit.  Do not believe she needs urgent neurosurgery evaluation at this time.  No red flag symptoms.  Plan: -Obtain records from prior neurosurgeon -Start amitriptyline 25 mg daily at bedtime.  If no improvement consider tramadol -Discontinue Cymbalta -Check B12 -Check for reversible causes and repeat imaging once patient has orange card approval.  These were offered to her but she would rather wait. -Refer to neurosurgery once orange card and approval occurs.

## 2020-09-30 NOTE — Progress Notes (Signed)
   CC: Numbness and tingling  HPI:  [name] is a 55 y.o. female with a past medical history stated below and presents today for numbness and tingling. Please see problem based assessment and plan for additional details.  Past Medical History:  Diagnosis Date  . Bronchitis     Current Outpatient Medications on File Prior to Visit  Medication Sig Dispense Refill  . cholecalciferol (VITAMIN D3) 25 MCG (1000 UNIT) tablet Take 1,000 Units by mouth daily.    . DULoxetine (CYMBALTA) 30 MG capsule Take 1 capsule (30 mg total) by mouth daily. Take 1 capsule (30 mg) by mouth daily for the first week. Starting on the second week take 2 capsules daily if you were able to tolerate the 30 mg daily. 30 capsule 2  . ferrous sulfate 325 (65 FE) MG tablet Take 325 mg by mouth every other day.    . fluticasone furoate-vilanterol (BREO ELLIPTA) 100-25 MCG/INH AEPB Inhale 1 puff into the lungs daily as needed (for flares).    . Multiple Vitamins-Calcium (ONE-A-DAY WOMENS FORMULA) TABS Take 2 tablets by mouth daily with breakfast.    . VENTOLIN HFA 108 (90 Base) MCG/ACT inhaler Inhale 2 puffs into the lungs every 6 (six) hours as needed for wheezing or shortness of breath.    . vitamin B-12 (CYANOCOBALAMIN) 1000 MCG tablet Take 1,000 mcg by mouth daily.     No current facility-administered medications on file prior to visit.    Family History  Problem Relation Age of Onset  . Diabetes Mother   . Hypertension Mother   . Chronic Renal Failure Mother   . Diabetes Father   . Hypertension Father     Social History   Socioeconomic History  . Marital status: Single    Spouse name: Not on file  . Number of children: 2  . Years of education: Not on file  . Highest education level: Not on file  Occupational History  . Occupation: Optometrist  Tobacco Use  . Smoking status: Never Smoker  . Smokeless tobacco: Never Used  Substance and Sexual Activity  . Alcohol use: Not Currently  .  Drug use: Not on file  . Sexual activity: Not on file  Other Topics Concern  . Not on file  Social History Narrative  . Not on file   Social Determinants of Health   Financial Resource Strain: Not on file  Food Insecurity: Not on file  Transportation Needs: Not on file  Physical Activity: Not on file  Stress: Not on file  Social Connections: Not on file  Intimate Partner Violence: Not on file    Review of Systems: ROS negative except for what is noted on the assessment and plan.  There were no vitals filed for this visit.   Physical Exam: Constitutional: Well-appearing, sitting comfortably, no acute distress HENT: normocephalic atraumatic, mucous membranes moist Eyes: conjunctiva non-erythematous Neck: supple Cardiovascular: regular rate and rhythm, no m/r/g Pulmonary/Chest: normal work of breathing on room air, lungs clear to auscultation bilaterally Neurological: alert & oriented x 3, 5/5 strength in bilateral upper and lower extremities Skin: warm and dry Psych: Normal mood and thought process   Assessment & Plan:   See Encounters Tab for problem based charting.  Patient discussed with Dr. Mackey Birchwood, D.O. [facility], PGY-1 Pager: [phone], Phone: [phone] Date 09/30/2020 Time 9:19 AM

## 2020-09-30 NOTE — Assessment & Plan Note (Deleted)
Assessment: We will repeat B12 level today.  Levels to be checked during oncology appointment however she missed that.  Patient notes she has been taking B12 supplementation.   Plan: -Check B12 level -Continue 1000 mcg daily

## 2020-09-30 NOTE — Patient Instructions (Addendum)
Thank you, Ms.Norberto Sorenson for allowing [place] to provide your care today. Today we discussed:  Numbness/Tingling We will continue to work with you to try to control your pain.  We will be starting you on amitriptyline 25 mg nightly.  This may cause dizziness as well as drowsiness, only take at night.  Please stop taking the Cymbalta.  We will check a B12 today and call you if these labs are abnormal.  Once you have insurance approval we will recommend you to neurology or neurosurgery as your pain is getting worse.  History of Gastric Bypass Please call and follow up with up with your oncologist.  Health Maintenance - Mammogram/Colon Cancer Screening We have provided you with the phone number of the free mobile cancer screening unit, they will screen you for breast cancer as well as colon cancer despite being uninsured.  Please call the number provided and have these tests done at your earliest convenience.  I have ordered the following labs for you:   Lab Orders     Vitamin B12   Tests ordered today:  None  Referrals ordered today:   Referral Orders  No referral(s) requested today     I have ordered the following medication/changed the following medications:   Stop the following medications: Medications Discontinued During This Encounter  Medication Reason  . DULoxetine (CYMBALTA) 30 MG capsule      Start the following medications: Meds ordered this encounter  Medications  . amitriptyline (ELAVIL) 25 MG tablet    Sig: Take 1 tablet (25 mg total) by mouth at bedtime.    Dispense:  90 tablet    Refill:  0     Follow up: 3 months    Remember: Please follow-up with our financial services to apply for orange card insurance.  Should you have any questions or concerns please call the internal medicine clinic at [phone].    Thalia Bloodgood, D.O. [facility]

## 2020-10-01 LAB — VITAMIN B12: Vitamin B-12: 656 pg/mL (ref 232–1245)

## 2020-12-01 ENCOUNTER — Other Ambulatory Visit: Payer: Self-pay

## 2020-12-01 ENCOUNTER — Encounter (HOSPITAL_COMMUNITY): Payer: Self-pay

## 2020-12-01 ENCOUNTER — Emergency Department (HOSPITAL_COMMUNITY)
Admission: EM | Admit: 2020-12-01 | Discharge: 2020-12-01 | Disposition: A | Discharge status: Home / Self Care | Payer: BC Managed Care – PPO | Attending: Emergency Medicine | Admitting: Emergency Medicine

## 2020-12-01 DIAGNOSIS — R300 Dysuria: Secondary | ICD-10-CM | POA: Diagnosis not present

## 2020-12-01 DIAGNOSIS — M79672 Pain in left foot: Secondary | ICD-10-CM | POA: Insufficient documentation

## 2020-12-01 DIAGNOSIS — R109 Unspecified abdominal pain: Secondary | ICD-10-CM | POA: Insufficient documentation

## 2020-12-01 DIAGNOSIS — R319 Hematuria, unspecified: Secondary | ICD-10-CM | POA: Insufficient documentation

## 2020-12-01 LAB — URINALYSIS, ROUTINE W REFLEX MICROSCOPIC
Bilirubin Urine: NEGATIVE
Glucose, UA: NEGATIVE mg/dL
Hgb urine dipstick: NEGATIVE
Ketones, ur: NEGATIVE mg/dL
Nitrite: POSITIVE — AB
Protein, ur: NEGATIVE mg/dL
Specific Gravity, Urine: 1.017 (ref 1.005–1.030)
pH: 5 (ref 5.0–8.0)

## 2020-12-01 MED ORDER — CEPHALEXIN 500 MG PO CAPS: 500.0000 mg | ORAL_CAPSULE | Freq: Four times a day (QID) | ORAL | 0 refills | Status: AC

## 2020-12-01 NOTE — ED Triage Notes (Addendum)
Pt reports right sided flank pain, hematuria, and urinary frequency that began last night. Pt also endorses left foot pain, and states she has hx of nerve damage in this foot.

## 2020-12-01 NOTE — ED Provider Notes (Signed)
[facility]-EMERGENCY DEPT Provider Note   CSN: [id number] Arrival date & time: 12/01/20  1152     History Chief Complaint  Patient presents with   Flank Pain   Foot Pain    Amber Arias is a 55 y.o. female.  55 year old female presents with several days of hematuria along with dysuria.  Self diagnosed herself with a UTI and has been treating with cranberry juice.  Developed some mild flank pain which is since resolved.  Denies any fever or chills.  Also notes chronic left foot pain due to prior fracture which is unchanged and unresponsive to her home medications.      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     Family History  Problem Relation Age of Onset   Diabetes Mother    Hypertension Mother    Chronic Renal Failure Mother    Diabetes Father    Hypertension Father     Social History   Tobacco Use   Smoking status: Never   Smokeless tobacco: Never  Substance Use Topics   Alcohol use: Not Currently    Home Medications Prior to Admission medications   Medication Sig Start Date End Date Taking? Authorizing Provider  amitriptyline (ELAVIL) 25 MG tablet Take 1 tablet (25 mg total) by mouth at bedtime. 09/30/20   Belva Agee, MD  cholecalciferol (VITAMIN D3) 25 MCG (1000 UNIT) tablet Take 1,000 Units by mouth daily.    [provider]  ferrous sulfate 325 (65 FE) MG tablet Take 325 mg by mouth every other  day.    [provider]  fluticasone furoate-vilanterol (BREO ELLIPTA) 100-25 MCG/INH AEPB Inhale 1 puff into the lungs daily as needed (for flares).    [provider]  Multiple Vitamins-Calcium (ONE-A-DAY WOMENS FORMULA) TABS Take 2 tablets by mouth daily with breakfast.    [provider]  VENTOLIN HFA 108 (90 Base) MCG/ACT inhaler Inhale 2 puffs into the lungs every 6 (six) hours as needed for wheezing or shortness of breath.    [provider]  vitamin B-12 (CYANOCOBALAMIN) 1000 MCG tablet Take 1,000 mcg by mouth daily.    [provider]    Allergies    Aspirin, Codeine, Penicillin g, Penicillins, and Pregabalin  Review of Systems   Review of Systems  All other systems reviewed and are negative.  Physical Exam Updated Vital Signs BP 131/83 (BP Location: Left Arm)   Pulse 86   Temp 98 F (36.7 C) (Oral)   Resp 18   Ht 1.727 m (5\' 8" )   Wt 84.8 kg   SpO2 98%   BMI 28.43 kg/m   Physical Exam Vitals and nursing note reviewed.  Constitutional:      General: She is not in acute distress.    Appearance: Normal appearance. She is well-developed. She is not toxic-appearing.  HENT:  Head: Normocephalic and atraumatic.  Eyes:     General: Lids are normal.     Conjunctiva/sclera: Conjunctivae normal.     Pupils: Pupils are equal, round, and reactive to light.  Neck:     Thyroid: No thyroid mass.     Trachea: No tracheal deviation.  Cardiovascular:     Rate and Rhythm: Normal rate and regular rhythm.     Heart sounds: Normal heart sounds. No murmur heard.   No gallop.  Pulmonary:     Effort: Pulmonary effort is normal. No respiratory distress.     Breath sounds: Normal breath sounds. No stridor. No decreased breath sounds, wheezing, rhonchi or rales.  Abdominal:     General: There is no distension.     Palpations: Abdomen is soft.     Tenderness: There is no abdominal tenderness. There is no rebound.  Musculoskeletal:         General: Normal range of motion.     Cervical back: Normal range of motion and neck supple.     Left foot: Tenderness present.     Comments: Neurovascular status intact left foot  Skin:    General: Skin is warm and dry.     Findings: No abrasion or rash.  Neurological:     Mental Status: She is alert and oriented to person, place, and time. Mental status is at baseline.     GCS: GCS eye subscore is 4. GCS verbal subscore is 5. GCS motor subscore is 6.     Cranial Nerves: Cranial nerves are intact. No cranial nerve deficit.     Sensory: No sensory deficit.     Motor: Motor function is intact.  Psychiatric:        Attention and Perception: Attention normal.        Speech: Speech normal.        Behavior: Behavior normal.    ED Results / Procedures / Treatments   Labs (all labs ordered are listed, but only abnormal results are displayed) Labs Reviewed  URINE CULTURE  URINALYSIS, ROUTINE W REFLEX MICROSCOPIC    EKG None  Radiology No results found.  Procedures Procedures   Medications Ordered in ED Medications - No data to display  ED Course  I have reviewed the triage vital signs and the nursing notes.  Pertinent labs & imaging results that were available during my care of the patient were reviewed by me and considered in my medical decision making (see chart for details).    MDM Rules/Calculators/A&P                          Patient with urinalysis consistent with infection.  Will place on Keflex and discharge Final Clinical Impression(s) / ED Diagnoses Final diagnoses:  None    Rx / DC Orders ED Discharge Orders     None        Lorre Nick, MD 12/01/20 1308

## 2020-12-03 LAB — URINE CULTURE: Culture: >=100000 — AB

## 2020-12-04 ENCOUNTER — Telehealth: Payer: Self-pay | Admitting: Emergency Medicine

## 2020-12-04 NOTE — Telephone Encounter (Signed)
Post ED Visit - Positive Culture Follow-up  Culture report reviewed by antimicrobial stewardship pharmacist: [facility] []  Nathan Batchelder, Pharm.D. []  , Pharm.D., BCPS AQ-ID []  , Pharm.D., BCPS []  Celedonio Miyamoto, Pharm.D., BCPS []  Leonardo, Garvin Fila.D., BCPS, AAHIVP []  , Pharm.D., BCPS, AAHIVP []  Georgina Pillion, PharmD, BCPS []  , PharmD, BCPS []  [place], PharmD, BCPS []  [address], PharmD []  , PharmD, BCPS []  Estella Husk, PharmD  Pharmacy Team []  Lysle Pearl, PharmD []  , PharmD []  Phillips Climes, PharmD []  , Rph []  Agapito Games) , PharmD []  Verlan Friends, PharmD []  , PharmD []  Mervyn Gay, PharmD []  , PharmD []  Vinnie Level, PharmD []  [facility], PharmD []  , PharmD []  Len Childs, PharmD   Positive urine culture Treated with cephalexin, no further patient follow-up is required at this time.  12/04/2020, 11:16 AM

## 2021-01-26 DIAGNOSIS — Z0000 Encounter for general adult medical examination without abnormal findings: Secondary | ICD-10-CM | POA: Diagnosis not present

## 2021-01-26 DIAGNOSIS — Z13220 Encounter for screening for lipoid disorders: Secondary | ICD-10-CM | POA: Diagnosis not present

## 2021-01-26 DIAGNOSIS — Z9889 Other specified postprocedural states: Secondary | ICD-10-CM | POA: Diagnosis not present

## 2021-03-31 DIAGNOSIS — W19XXXA Unspecified fall, initial encounter: Secondary | ICD-10-CM | POA: Diagnosis not present

## 2021-03-31 DIAGNOSIS — M24549 Contracture, unspecified hand: Secondary | ICD-10-CM | POA: Diagnosis not present

## 2021-03-31 DIAGNOSIS — M542 Cervicalgia: Secondary | ICD-10-CM | POA: Diagnosis not present

## 2021-03-31 DIAGNOSIS — R200 Anesthesia of skin: Secondary | ICD-10-CM | POA: Diagnosis not present

## 2021-04-15 DIAGNOSIS — Z23 Encounter for immunization: Secondary | ICD-10-CM | POA: Diagnosis not present

## 2021-05-20 DIAGNOSIS — R0981 Nasal congestion: Secondary | ICD-10-CM | POA: Diagnosis not present

## 2021-05-20 DIAGNOSIS — J101 Influenza due to other identified influenza virus with other respiratory manifestations: Secondary | ICD-10-CM | POA: Diagnosis not present

## 2021-05-20 DIAGNOSIS — Z03818 Encounter for observation for suspected exposure to other biological agents ruled out: Secondary | ICD-10-CM | POA: Diagnosis not present

## 2021-05-20 DIAGNOSIS — R509 Fever, unspecified: Secondary | ICD-10-CM | POA: Diagnosis not present

## 2021-05-20 DIAGNOSIS — R059 Cough, unspecified: Secondary | ICD-10-CM | POA: Diagnosis not present

## 2021-07-01 DIAGNOSIS — M5412 Radiculopathy, cervical region: Secondary | ICD-10-CM | POA: Diagnosis not present

## 2021-07-01 DIAGNOSIS — Z6828 Body mass index (BMI) 28.0-28.9, adult: Secondary | ICD-10-CM | POA: Diagnosis not present

## 2021-07-10 ENCOUNTER — Other Ambulatory Visit: Payer: Self-pay | Admitting: Neurological Surgery

## 2021-07-10 DIAGNOSIS — M5412 Radiculopathy, cervical region: Secondary | ICD-10-CM

## 2021-08-01 ENCOUNTER — Other Ambulatory Visit: Payer: BC Managed Care – PPO

## 2021-08-05 ENCOUNTER — Other Ambulatory Visit: Payer: Self-pay

## 2021-08-05 ENCOUNTER — Ambulatory Visit
Admission: RE | Admit: 2021-08-05 | Discharge: 2021-08-05 | Disposition: A | Discharge status: Home / Self Care | Payer: BC Managed Care – PPO | Source: Ambulatory Visit | Attending: Neurological Surgery | Admitting: Neurological Surgery

## 2021-08-05 DIAGNOSIS — R200 Anesthesia of skin: Secondary | ICD-10-CM | POA: Diagnosis not present

## 2021-08-05 DIAGNOSIS — M4802 Spinal stenosis, cervical region: Secondary | ICD-10-CM | POA: Diagnosis not present

## 2021-08-05 DIAGNOSIS — M5412 Radiculopathy, cervical region: Secondary | ICD-10-CM

## 2021-09-16 DIAGNOSIS — M5412 Radiculopathy, cervical region: Secondary | ICD-10-CM | POA: Diagnosis not present

## 2021-12-28 DIAGNOSIS — D2272 Melanocytic nevi of left lower limb, including hip: Secondary | ICD-10-CM | POA: Diagnosis not present

## 2021-12-28 DIAGNOSIS — C44799 Other specified malignant neoplasm of skin of left lower limb, including hip: Secondary | ICD-10-CM | POA: Diagnosis not present

## 2021-12-28 DIAGNOSIS — L218 Other seborrheic dermatitis: Secondary | ICD-10-CM | POA: Diagnosis not present

## 2021-12-28 DIAGNOSIS — C4499 Other specified malignant neoplasm of skin, unspecified: Secondary | ICD-10-CM | POA: Diagnosis not present

## 2021-12-28 DIAGNOSIS — D485 Neoplasm of uncertain behavior of skin: Secondary | ICD-10-CM | POA: Diagnosis not present

## 2022-01-18 DIAGNOSIS — C44799 Other specified malignant neoplasm of skin of left lower limb, including hip: Secondary | ICD-10-CM | POA: Diagnosis not present

## 2022-01-25 DIAGNOSIS — R5383 Other fatigue: Secondary | ICD-10-CM | POA: Diagnosis not present

## 2022-01-25 DIAGNOSIS — Z03818 Encounter for observation for suspected exposure to other biological agents ruled out: Secondary | ICD-10-CM | POA: Diagnosis not present

## 2022-02-18 DIAGNOSIS — C44799 Other specified malignant neoplasm of skin of left lower limb, including hip: Secondary | ICD-10-CM | POA: Diagnosis not present

## 2022-03-19 DIAGNOSIS — C44799 Other specified malignant neoplasm of skin of left lower limb, including hip: Secondary | ICD-10-CM | POA: Diagnosis not present

## 2023-05-11 IMAGING — MR MR CERVICAL SPINE W/O CM
4 of 5 series · 28 of 48 positions shown · non-contrast
Comparison: None.

CLINICAL DATA: Numbness, burning, and weakness in the arms.

EXAM:
MRI CERVICAL SPINE WITHOUT CONTRAST
TECHNIQUE: Multiplanar, multisequence MR imaging of the cervical spine was
performed. No intravenous contrast was administered.

[Series 2: T2 · sagittal · 3.0mm · 0.66mm/px · 6 of 15 slices shown (1 of 2)]
[im 1/15]
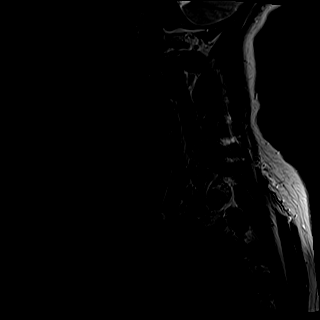
[im 3/15]
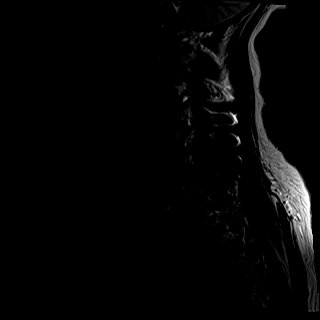
[im 6/15]
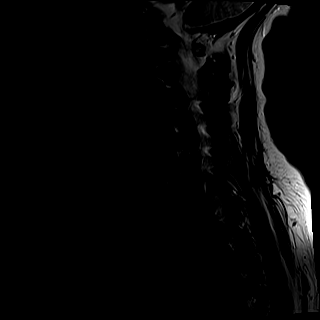
[im 9/15]
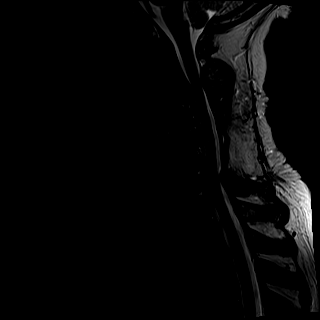
[im 12/15]
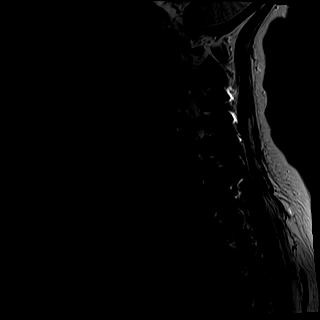
[im 15/15]
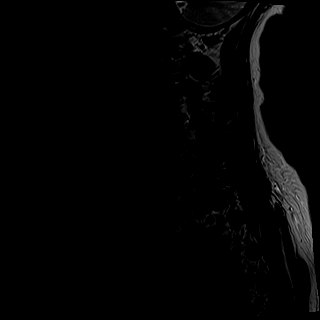

[Series 3: T1 · sagittal · 3.0mm · 0.41mm/px · 7 of 15 slices shown]
[im 1/15]
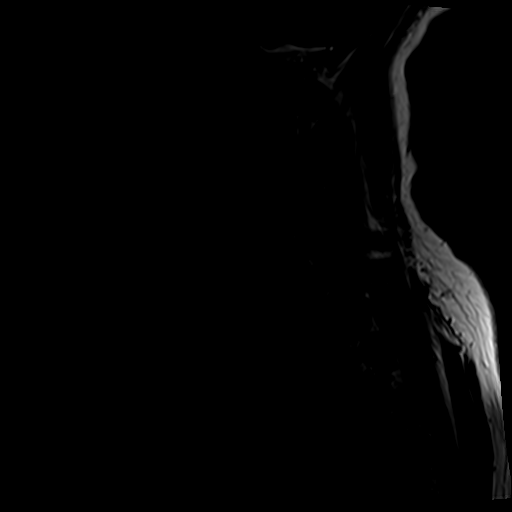
[im 3/15]
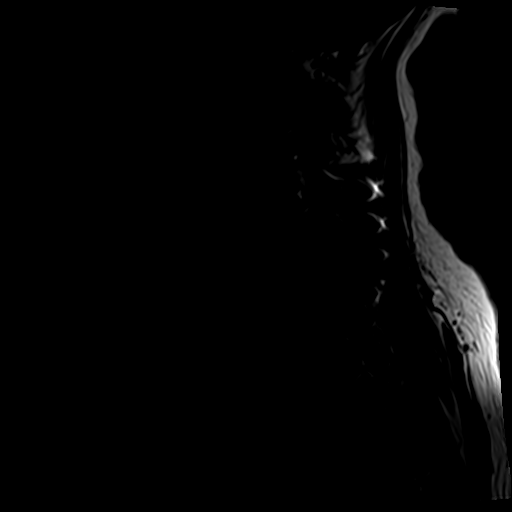
[im 5/15]
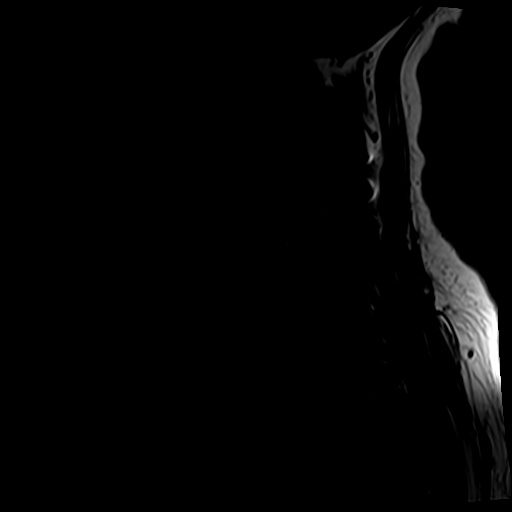
[im 8/15]
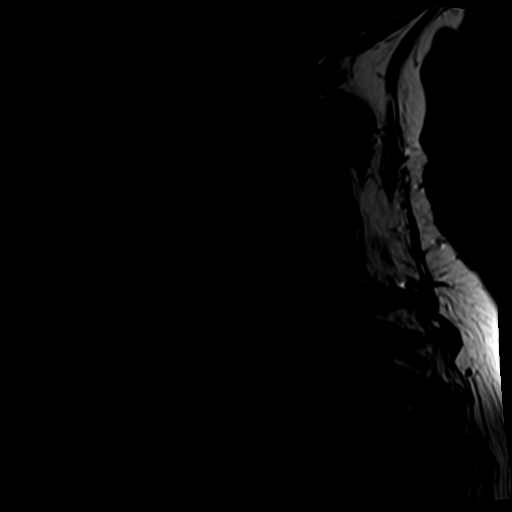
[im 10/15]
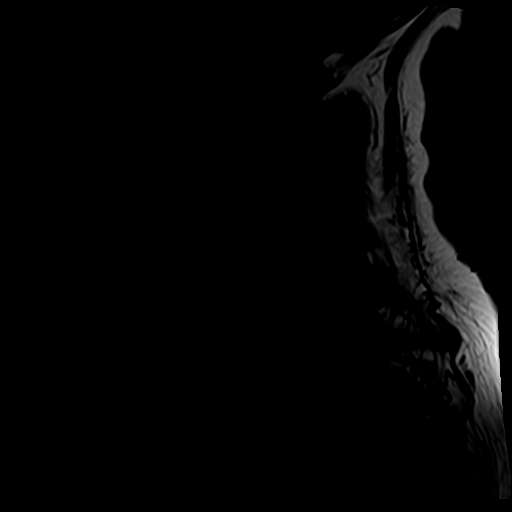
[im 12/15]
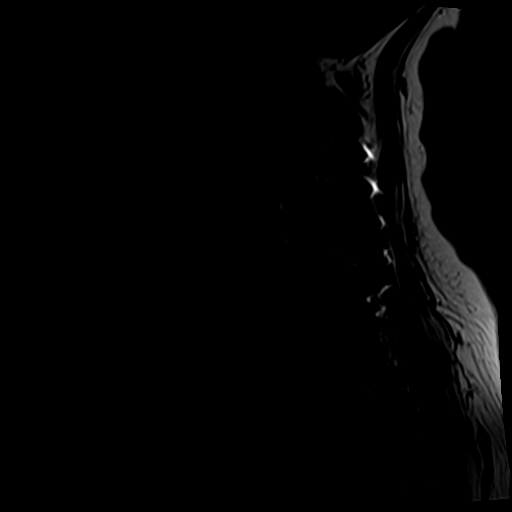
[im 15/15]
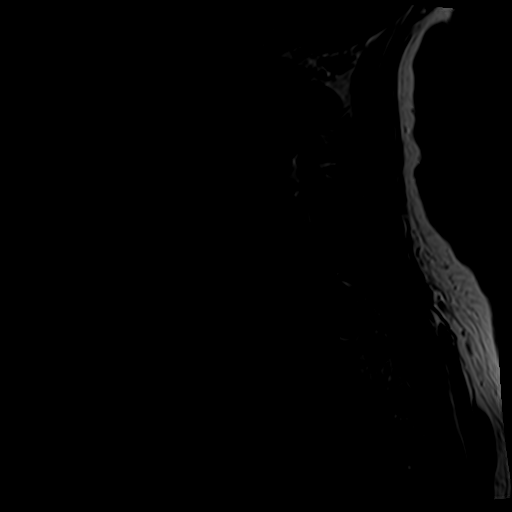

[Series 4: tir sag · sagittal · 3.0mm · 0.41mm/px · 7 of 15 slices shown]
[im 1/15]
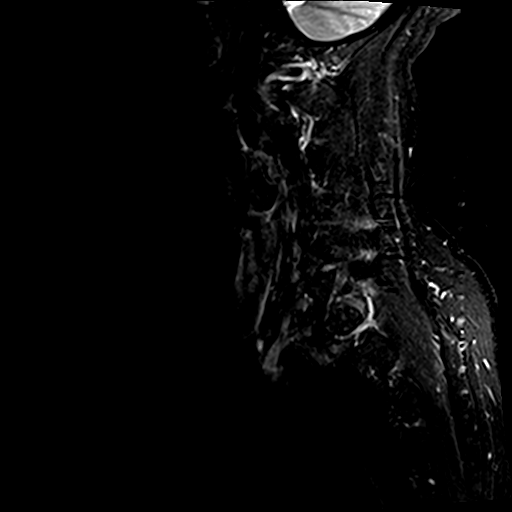
[im 3/15]
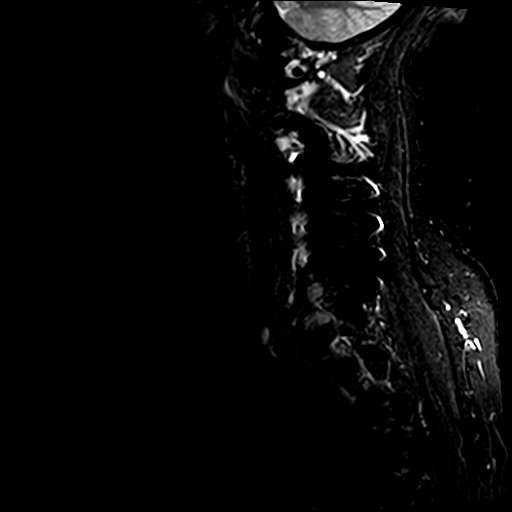
[im 5/15]
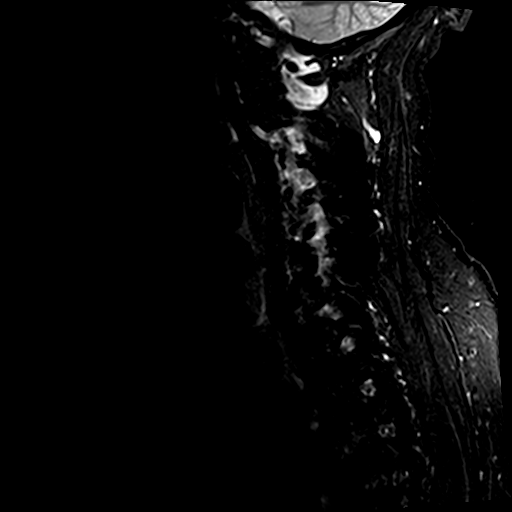
[im 8/15]
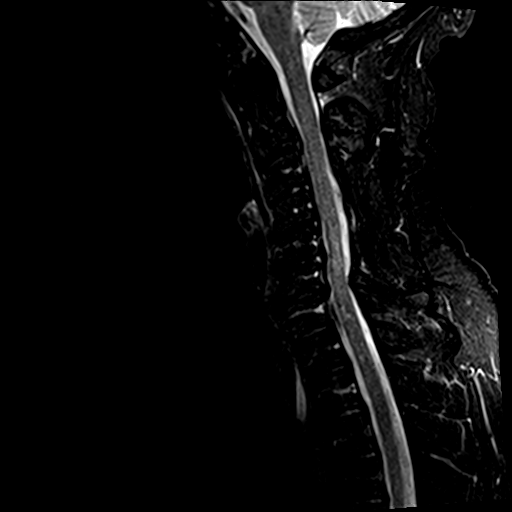
[im 10/15]
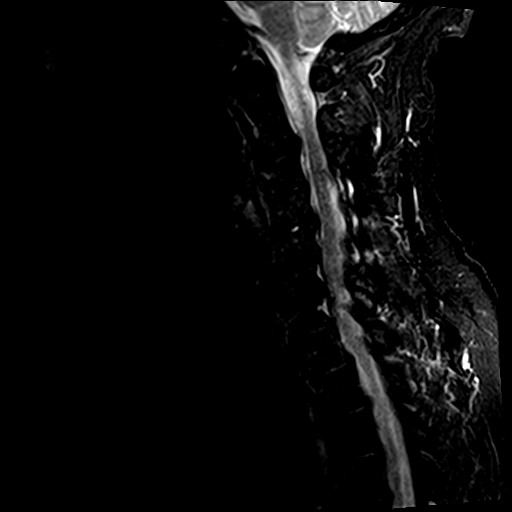
[im 12/15]
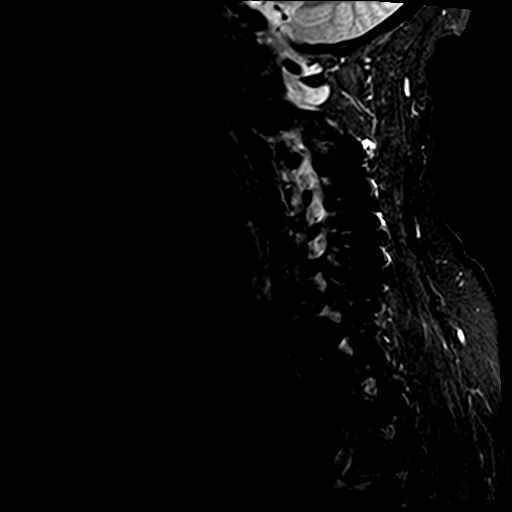
[im 15/15]
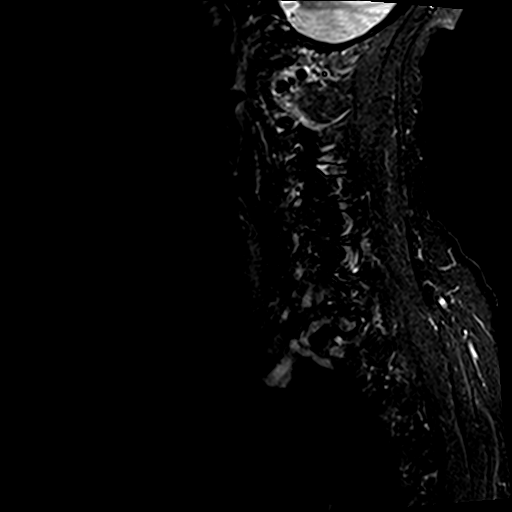

[Series 6: T2 · axial · 3.0mm · 0.70mm/px · z∈[-27,+79]mm · 8 of 30 slices shown (2 of 2)]
[im 1/30]
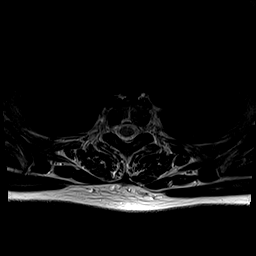
[im 5/30]
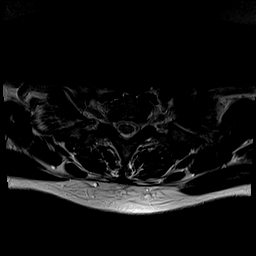
[im 9/30]
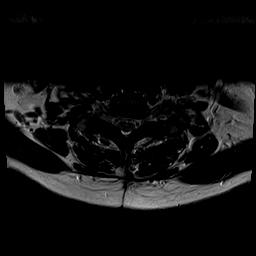
[im 14/30]
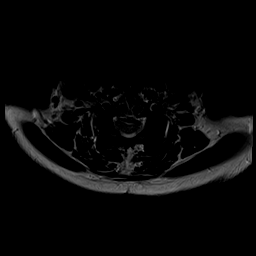
[im 16/30]
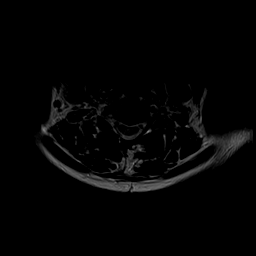
[im 21/30]
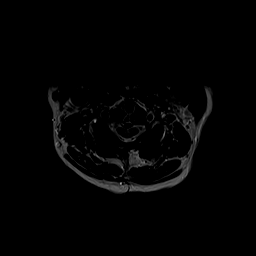
[im 25/30]
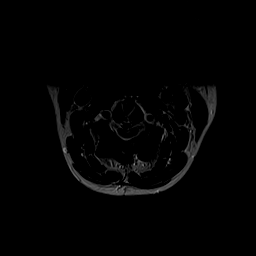
[im 30/30]
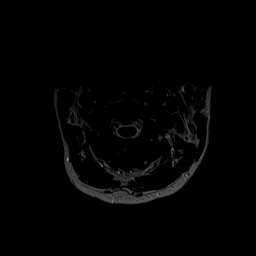

[28 of 48 positions shown; findings below may reference images not displayed]

FINDINGS: Alignment: Reversal of cervical lordosis

Vertebrae: Posterior-lateral fusion and laminectomy at C3-C6. no
evidence of fracture, discitis, or aggressive bone lesion.

Cord: Herniations indent the ventral cord at multiple levels, as
described below. No cord edema.

Posterior Fossa, vertebral arteries, paraspinal tissues: Expected
postoperative scarring posteriorly. No evidence of inflammation or
mass around the spine

Disc levels:

C2-3: Disc protrusion indenting the ventral cord. Ligamentum flavum
thickening posteriorly. Patent foramina

C3-4: Disc narrowing with annular fissure and right paracentral
protrusion indenting the cord. Patent foramina.

C4-5: Disc narrowing with central protrusion indenting the ventral
cord. Patent foramina

C5-6: Disc narrowing with left paracentral protrusion and ridging
indenting the ventral cord. Patent foramina

C6-7: Disc narrowing with bulging and uncovertebral ridging
eccentric to the left where there is left foraminal impingement.
Mild spinal stenosis with cord flattening from disc bulge and
ligamentum flavum thickening.

C7-T1:Degenerative facet spurring bilaterally. No neural
impingement.
IMPRESSION: 1. Disc protrusions indenting the ventral cord at C2-3 to the C6-7.
Good thecal sac patency at C3-C6 where there has been prior
laminectomy.
2. C6-7 degenerative left foraminal impingement. Otherwise diffuse
foraminal patency.
